# Patient Record
Sex: Male | Born: 1945 | Race: White | Hispanic: No
Health system: Southern US, Community
[De-identification: ages and names within clinical notes are randomized; demographics above are authoritative.]

## PROBLEM LIST (undated history)

## (undated) DIAGNOSIS — S069XAA Unspecified intracranial injury with loss of consciousness status unknown, initial encounter: Secondary | ICD-10-CM

## (undated) DIAGNOSIS — A0472 Enterocolitis due to Clostridium difficile, not specified as recurrent: Secondary | ICD-10-CM

## (undated) DIAGNOSIS — J942 Hemothorax: Secondary | ICD-10-CM

## (undated) DIAGNOSIS — S066X9S Traumatic subarachnoid hemorrhage with loss of consciousness of unspecified duration, sequela: Secondary | ICD-10-CM

## (undated) DIAGNOSIS — J9621 Acute and chronic respiratory failure with hypoxia: Secondary | ICD-10-CM

## (undated) DIAGNOSIS — J189 Pneumonia, unspecified organism: Secondary | ICD-10-CM

## (undated) DIAGNOSIS — S069X9A Unspecified intracranial injury with loss of consciousness of unspecified duration, initial encounter: Secondary | ICD-10-CM

## (undated) DIAGNOSIS — I482 Chronic atrial fibrillation, unspecified: Secondary | ICD-10-CM

---

## 2019-06-29 ENCOUNTER — Inpatient Hospital Stay
Admission: EM | Admit: 2019-06-29 | Discharge: 2019-07-27 | Disposition: A | Discharge status: Skilled Nursing Facility | Payer: Medicare Other | Attending: Internal Medicine | Admitting: Internal Medicine

## 2019-06-29 DIAGNOSIS — Z8781 Personal history of (healed) traumatic fracture: Secondary | ICD-10-CM

## 2019-06-29 DIAGNOSIS — I482 Chronic atrial fibrillation, unspecified: Secondary | ICD-10-CM | POA: Diagnosis present

## 2019-06-29 DIAGNOSIS — J189 Pneumonia, unspecified organism: Secondary | ICD-10-CM

## 2019-06-29 DIAGNOSIS — S069XAA Unspecified intracranial injury with loss of consciousness status unknown, initial encounter: Secondary | ICD-10-CM | POA: Diagnosis present

## 2019-06-29 DIAGNOSIS — J90 Pleural effusion, not elsewhere classified: Secondary | ICD-10-CM

## 2019-06-29 DIAGNOSIS — S066X9S Traumatic subarachnoid hemorrhage with loss of consciousness of unspecified duration, sequela: Secondary | ICD-10-CM | POA: Diagnosis present

## 2019-06-29 DIAGNOSIS — S82401A Unspecified fracture of shaft of right fibula, initial encounter for closed fracture: Secondary | ICD-10-CM

## 2019-06-29 DIAGNOSIS — S82209A Unspecified fracture of shaft of unspecified tibia, initial encounter for closed fracture: Secondary | ICD-10-CM

## 2019-06-29 DIAGNOSIS — T859XXA Unspecified complication of internal prosthetic device, implant and graft, initial encounter: Secondary | ICD-10-CM

## 2019-06-29 DIAGNOSIS — J9690 Respiratory failure, unspecified, unspecified whether with hypoxia or hypercapnia: Secondary | ICD-10-CM

## 2019-06-29 DIAGNOSIS — R319 Hematuria, unspecified: Secondary | ICD-10-CM

## 2019-06-29 DIAGNOSIS — Z931 Gastrostomy status: Secondary | ICD-10-CM

## 2019-06-29 DIAGNOSIS — S82409A Unspecified fracture of shaft of unspecified fibula, initial encounter for closed fracture: Secondary | ICD-10-CM

## 2019-06-29 DIAGNOSIS — S82201A Unspecified fracture of shaft of right tibia, initial encounter for closed fracture: Secondary | ICD-10-CM

## 2019-06-29 DIAGNOSIS — S069X9A Unspecified intracranial injury with loss of consciousness of unspecified duration, initial encounter: Secondary | ICD-10-CM | POA: Diagnosis present

## 2019-06-29 DIAGNOSIS — Z4682 Encounter for fitting and adjustment of non-vascular catheter: Secondary | ICD-10-CM

## 2019-06-29 DIAGNOSIS — S8255XA Nondisplaced fracture of medial malleolus of left tibia, initial encounter for closed fracture: Secondary | ICD-10-CM

## 2019-06-29 DIAGNOSIS — A0472 Enterocolitis due to Clostridium difficile, not specified as recurrent: Secondary | ICD-10-CM | POA: Diagnosis present

## 2019-06-29 DIAGNOSIS — J9621 Acute and chronic respiratory failure with hypoxia: Secondary | ICD-10-CM | POA: Diagnosis present

## 2019-06-29 HISTORY — DX: Unspecified intracranial injury with loss of consciousness status unknown, initial encounter: S06.9XAA

## 2019-06-29 HISTORY — DX: Unspecified intracranial injury with loss of consciousness of unspecified duration, initial encounter: S06.9X9A

## 2019-06-29 HISTORY — DX: Acute and chronic respiratory failure with hypoxia: J96.21

## 2019-06-29 HISTORY — DX: Pneumonia, unspecified organism: J18.9

## 2019-06-29 HISTORY — DX: Hemothorax: J94.2

## 2019-06-29 HISTORY — DX: Enterocolitis due to Clostridium difficile, not specified as recurrent: A04.72

## 2019-06-29 HISTORY — DX: Chronic atrial fibrillation, unspecified: I48.20

## 2019-06-29 HISTORY — DX: Traumatic subarachnoid hemorrhage with loss of consciousness of unspecified duration, sequela: S06.6X9S

## 2019-06-30 ENCOUNTER — Other Ambulatory Visit (HOSPITAL_COMMUNITY): Payer: Medicare Other

## 2019-06-30 DIAGNOSIS — A0472 Enterocolitis due to Clostridium difficile, not specified as recurrent: Secondary | ICD-10-CM | POA: Diagnosis not present

## 2019-06-30 DIAGNOSIS — J9621 Acute and chronic respiratory failure with hypoxia: Secondary | ICD-10-CM

## 2019-06-30 DIAGNOSIS — S069X9S Unspecified intracranial injury with loss of consciousness of unspecified duration, sequela: Secondary | ICD-10-CM

## 2019-06-30 DIAGNOSIS — S066X9S Traumatic subarachnoid hemorrhage with loss of consciousness of unspecified duration, sequela: Secondary | ICD-10-CM

## 2019-06-30 DIAGNOSIS — I482 Chronic atrial fibrillation, unspecified: Secondary | ICD-10-CM

## 2019-06-30 DIAGNOSIS — J189 Pneumonia, unspecified organism: Secondary | ICD-10-CM

## 2019-06-30 LAB — SARS CORONAVIRUS 2 (TAT 6-24 HRS): SARS Coronavirus 2: NEGATIVE

## 2019-06-30 LAB — BLOOD GAS, ARTERIAL
Acid-Base Excess: 5.8 mmol/L — ABNORMAL HIGH (ref 0.0–2.0)
Bicarbonate: 28.5 mmol/L — ABNORMAL HIGH (ref 20.0–28.0)
FIO2: 28
O2 Saturation: 95.1 %
Patient temperature: 37
pCO2 arterial: 32.2 mmHg (ref 32.0–48.0)
pH, Arterial: 7.555 — ABNORMAL HIGH (ref 7.350–7.450)
pO2, Arterial: 66.8 mmHg — ABNORMAL LOW (ref 83.0–108.0)

## 2019-06-30 LAB — COMPREHENSIVE METABOLIC PANEL
ALT: 95 U/L — ABNORMAL HIGH (ref 0–44)
AST: 51 U/L — ABNORMAL HIGH (ref 15–41)
Albumin: 2.5 g/dL — ABNORMAL LOW (ref 3.5–5.0)
Alkaline Phosphatase: 358 U/L — ABNORMAL HIGH (ref 38–126)
Anion gap: 10 (ref 5–15)
BUN: 22 mg/dL (ref 8–23)
CO2: 26 mmol/L (ref 22–32)
Calcium: 8.5 mg/dL — ABNORMAL LOW (ref 8.9–10.3)
Chloride: 100 mmol/L (ref 98–111)
Creatinine, Ser: 0.53 mg/dL — ABNORMAL LOW (ref 0.61–1.24)
GFR calc Af Amer: >60 mL/min (ref >60)
GFR calc non Af Amer: >60 mL/min (ref >60)
Glucose, Bld: 115 mg/dL — ABNORMAL HIGH (ref 70–99)
Potassium: 4 mmol/L (ref 3.5–5.1)
Sodium: 136 mmol/L (ref 135–145)
Total Bilirubin: 1.1 mg/dL (ref 0.3–1.2)
Total Protein: 6.3 g/dL — ABNORMAL LOW (ref 6.5–8.1)

## 2019-06-30 LAB — CBC
HCT: 29 % — ABNORMAL LOW (ref 39.0–52.0)
Hemoglobin: 9 g/dL — ABNORMAL LOW (ref 13.0–17.0)
MCH: 28.1 pg (ref 26.0–34.0)
MCHC: 31 g/dL (ref 30.0–36.0)
MCV: 90.6 fL (ref 80.0–100.0)
Platelets: 471 10*3/uL — ABNORMAL HIGH (ref 150–400)
RBC: 3.2 MIL/uL — ABNORMAL LOW (ref 4.22–5.81)
RDW: 17.8 % — ABNORMAL HIGH (ref 11.5–15.5)
WBC: 11.2 10*3/uL — ABNORMAL HIGH (ref 4.0–10.5)
nRBC: 0.8 % — ABNORMAL HIGH (ref 0.0–0.2)

## 2019-06-30 LAB — C-REACTIVE PROTEIN: CRP: 8.5 mg/dL — ABNORMAL HIGH (ref <1.0)

## 2019-06-30 LAB — PROTIME-INR
INR: 1.2 (ref 0.8–1.2)
Prothrombin Time: 15.2 seconds (ref 11.4–15.2)

## 2019-06-30 MED ORDER — SODIUM CHLORIDE 3 % IN NEBU
3.00 | INHALATION_SOLUTION | RESPIRATORY_TRACT | Status: DC
Start: ? — End: 2019-06-30

## 2019-06-30 MED ORDER — GENERIC EXTERNAL MEDICATION
Status: DC
Start: ? — End: 2019-06-30

## 2019-06-30 MED ORDER — CALCIUM CARBONATE ANTACID 500 MG PO CHEW
1.00 | CHEWABLE_TABLET | ORAL | Status: DC
Start: 2019-06-30 — End: 2019-06-30

## 2019-06-30 MED ORDER — ACETAMINOPHEN 160 MG/5ML PO SOLN
650.00 | ORAL | Status: DC
Start: ? — End: 2019-06-30

## 2019-06-30 MED ORDER — GENERIC EXTERNAL MEDICATION
2.00 | Status: DC
Start: 2019-06-29 — End: 2019-06-30

## 2019-06-30 MED ORDER — AMANTADINE HCL 50 MG/5ML PO SYRP
100.00 | ORAL_SOLUTION | ORAL | Status: DC
Start: 2019-06-30 — End: 2019-06-30

## 2019-06-30 MED ORDER — GLUCOSE 40 % PO GEL
1.00 | ORAL | Status: DC
Start: ? — End: 2019-06-30

## 2019-06-30 MED ORDER — IOHEXOL 300 MG/ML  SOLN
40.0000 mL | Freq: Once | INTRAMUSCULAR | Status: AC | PRN
  Administered 2019-06-30: 01:00:00 40 mL

## 2019-06-30 MED ORDER — GENERIC EXTERNAL MEDICATION
6.00 | Status: DC
Start: 2019-06-29 — End: 2019-06-30

## 2019-06-30 MED ORDER — GENERIC EXTERNAL MEDICATION
50.00 | Status: DC
Start: 2019-06-29 — End: 2019-06-30

## 2019-06-30 MED ORDER — INSULIN ASPART 100 UNIT/ML FLEXPEN
PEN_INJECTOR | SUBCUTANEOUS | Status: DC
Start: 2019-06-30 — End: 2019-06-30

## 2019-06-30 MED ORDER — GENERIC EXTERNAL MEDICATION
5.00 | Status: DC
Start: ? — End: 2019-06-30

## 2019-06-30 MED ORDER — NUTRISOURCE FIBER PO PACK
1.00 | PACK | ORAL | Status: DC
Start: 2019-06-29 — End: 2019-06-30

## 2019-06-30 MED ORDER — FAMOTIDINE 20 MG PO TABS
20.00 | ORAL_TABLET | ORAL | Status: DC
Start: 2019-06-29 — End: 2019-06-30

## 2019-06-30 MED ORDER — MULTIVITAMIN & MINERAL PO LIQD
15.00 | ORAL | Status: DC
Start: 2019-06-30 — End: 2019-06-30

## 2019-06-30 MED ORDER — DEXTROSE 50 % IV SOLN
25.00 | INTRAVENOUS | Status: DC
Start: ? — End: 2019-06-30

## 2019-06-30 MED ORDER — GENERIC EXTERNAL MEDICATION
1.00 | Status: DC
Start: ? — End: 2019-06-30

## 2019-06-30 MED ORDER — BACITRACIN ZINC 500 UNIT/GM EX OINT
1.00 | TOPICAL_OINTMENT | CUTANEOUS | Status: DC
Start: 2019-06-29 — End: 2019-06-30

## 2019-06-30 MED ORDER — METOPROLOL TARTRATE 5 MG/5ML IV SOLN
5.00 | INTRAVENOUS | Status: DC
Start: ? — End: 2019-06-30

## 2019-06-30 MED ORDER — ALBUTEROL SULFATE (2.5 MG/3ML) 0.083% IN NEBU
2.50 | INHALATION_SOLUTION | RESPIRATORY_TRACT | Status: DC
Start: 2019-06-30 — End: 2019-06-30

## 2019-06-30 MED ORDER — GLUCAGON (RDNA) 1 MG IJ KIT
1.00 | PACK | INTRAMUSCULAR | Status: DC
Start: ? — End: 2019-06-30

## 2019-06-30 MED ORDER — ONDANSETRON HCL 4 MG/2ML IJ SOLN
4.00 | INTRAMUSCULAR | Status: DC
Start: ? — End: 2019-06-30

## 2019-06-30 MED ORDER — RA PROBIOTIC DIGESTIVE CARE PO CAPS
1.00 | ORAL_CAPSULE | ORAL | Status: DC
Start: 2019-06-29 — End: 2019-06-30

## 2019-06-30 MED ORDER — ASCORBIC ACID 500 MG PO TABS
250.00 | ORAL_TABLET | ORAL | Status: DC
Start: 2019-06-30 — End: 2019-06-30

## 2019-06-30 MED ORDER — COLLAGENASE 250 UNIT/GM EX OINT
TOPICAL_OINTMENT | CUTANEOUS | Status: DC
Start: 2019-06-30 — End: 2019-06-30

## 2019-06-30 MED ORDER — POLYETHYLENE GLYCOL 3350 17 GM/SCOOP PO POWD
17.00 | ORAL | Status: DC
Start: 2019-06-29 — End: 2019-06-30
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
## 2019-07-01 DIAGNOSIS — A0472 Enterocolitis due to Clostridium difficile, not specified as recurrent: Secondary | ICD-10-CM | POA: Diagnosis not present

## 2019-07-01 DIAGNOSIS — J9621 Acute and chronic respiratory failure with hypoxia: Secondary | ICD-10-CM | POA: Diagnosis not present

## 2019-07-01 DIAGNOSIS — I482 Chronic atrial fibrillation, unspecified: Secondary | ICD-10-CM | POA: Diagnosis not present

## 2019-07-01 DIAGNOSIS — J189 Pneumonia, unspecified organism: Secondary | ICD-10-CM | POA: Diagnosis not present

## 2019-07-01 NOTE — Progress Notes (Signed)
Pulmonary Critical Care Medicine [place]   PULMONARY CRITICAL CARE SERVICE  PROGRESS NOTE  Date of Service: 07/01/2019  Anup Brigham  SAY:[id number]  DOB: Jul 14, 1945   DOA: 06/29/2019  Referring Physician: Merton Border, MD  HPI: Paul West is a 74 y.o. male seen for follow up of Acute on Chronic Respiratory Failure.  Patient currently is on 35% FiO2 on T collar has a #8 tracheostomy in place discussed during rounds we will go ahead and change the trach out today  Medications: Reviewed on Rounds  Physical Exam:  Vitals: Temperature 99.4 pulse 86 respiratory rate 34 blood pressure is 152/64 saturations 95%  Ventilator Settings on T collar with an FiO2 of 35%  . General: Comfortable at this time . Eyes: Grossly normal lids, irises & conjunctiva . ENT: grossly tongue is normal . Neck: no obvious mass . Cardiovascular: S1 S2 normal no gallop . Respiratory: Scattered rhonchi expansion is equal at this time . Abdomen: soft . Skin: no rash seen on limited exam . Musculoskeletal: not rigid . Psychiatric:unable to assess . Neurologic: no seizure no involuntary movements         Lab Data:   Basic Metabolic Panel: Recent Labs  Lab 06/30/19 0322  NA 136  K 4.0  CL 100  CO2 26  GLUCOSE 115*  BUN 22  CREATININE 0.53*  CALCIUM 8.5*    ABG: Recent Labs  Lab 06/30/19 0302  PHART 7.555*  PCO2ART 32.2  PO2ART 66.8*  HCO3 28.5*  O2SAT 95.1    Liver Function Tests: Recent Labs  Lab 06/30/19 0322  AST 51*  ALT 95*  ALKPHOS 358*  BILITOT 1.1  PROT 6.3*  ALBUMIN 2.5*   No results for input(s): LIPASE, AMYLASE in the last 168 hours. No results for input(s): AMMONIA in the last 168 hours.  CBC: Recent Labs  Lab 06/30/19 0322  WBC 11.2*  HGB 9.0*  HCT 29.0*  MCV 90.6  PLT 471*    Cardiac Enzymes: No results for input(s): CKTOTAL, CKMB, CKMBINDEX, TROPONINI in the last 168 hours.  BNP (last 3 results) No results for input(s):  BNP in the last 8760 hours.  ProBNP (last 3 results) No results for input(s): PROBNP in the last 8760 hours.  Radiological Exams: DG ABDOMEN PEG TUBE LOCATION  Result Date: 06/30/2019 CLINICAL DATA:  74 year old male with respiratory failure. EXAM: PORTABLE CHEST 1 VIEW PORTABLE ABDOMEN ONE VIEW. COMPARISON:  None. FINDINGS: There is mild cardiomegaly with vascular congestion. Small bilateral pleural effusions and bibasilar atelectasis versus infiltrate, right greater than left. No pneumothorax. Tracheostomy with tip above the carina. Percutaneous gastrostomy with balloon over the body of the stomach. A right-sided percutaneous pigtail catheter with tip projecting over the heart. The osseous structures are intact. IMPRESSION: 1. Mild cardiomegaly with mild vascular congestion. 2. Small bilateral pleural effusions and bibasilar atelectasis versus infiltrate. 3. Support apparatus as described. Electronically Signed   By: Anner Crete M.D.   On: 06/30/2019 01:17   DG Chest Port 1 View  Result Date: 06/30/2019 CLINICAL DATA:  75 year old male with respiratory failure. EXAM: PORTABLE CHEST 1 VIEW PORTABLE ABDOMEN ONE VIEW. COMPARISON:  None. FINDINGS: There is mild cardiomegaly with vascular congestion. Small bilateral pleural effusions and bibasilar atelectasis versus infiltrate, right greater than left. No pneumothorax. Tracheostomy with tip above the carina. Percutaneous gastrostomy with balloon over the body of the stomach. A right-sided percutaneous pigtail catheter with tip projecting over the heart. The osseous structures are intact. IMPRESSION: 1. Mild cardiomegaly  with mild vascular congestion. 2. Small bilateral pleural effusions and bibasilar atelectasis versus infiltrate. 3. Support apparatus as described. Electronically Signed   By: Elgie Collard M.D.   On: 06/30/2019 01:17    Assessment/Plan Active Problems:   Acute on chronic respiratory failure with hypoxia (HCC)   Chronic atrial  fibrillation (HCC)   Traumatic brain injury (HCC)   Traumatic subarachnoid hemorrhage with loss of consciousness of unspecified duration, sequela (HCC)   Healthcare-associated pneumonia   Clostridium enterocolitis   1. Acute on chronic respiratory failure hypoxia the plan is to continue with T collar trials patient currently is on 35% FiO2 good saturations are noted.  Continue pulmonary toilet supportive care patient's tracheostomy will be changed out today has a rather large trach and I think smaller trach will help [place] to be able to wean. 2. Cardiomegaly congestive heart failure patient's last chest x-ray shows cardiomegaly and vascular congestion would diurese as tolerated. 3. Traumatic brain injury no changes at this time we will continue supportive care 4. Chronic atrial fibrillation rate controlled 5. Healthcare associated pneumonia treated follow radiologically 6. C. difficile colitis treated we will continue with supportive care   I have personally seen and evaluated the patient, evaluated laboratory and imaging results, formulated the assessment and plan and placed orders. The Patient requires high complexity decision making with multiple systems involvement.  Rounds were done with the Respiratory Therapy Director and Staff therapists and discussed with nursing staff also.  Yevonne Pax, MD [facility] Pulmonary Critical Care Medicine Sleep Medicine

## 2019-07-02 DIAGNOSIS — I482 Chronic atrial fibrillation, unspecified: Secondary | ICD-10-CM | POA: Diagnosis not present

## 2019-07-02 DIAGNOSIS — J9621 Acute and chronic respiratory failure with hypoxia: Secondary | ICD-10-CM | POA: Diagnosis not present

## 2019-07-02 DIAGNOSIS — J189 Pneumonia, unspecified organism: Secondary | ICD-10-CM | POA: Diagnosis not present

## 2019-07-02 DIAGNOSIS — A0472 Enterocolitis due to Clostridium difficile, not specified as recurrent: Secondary | ICD-10-CM | POA: Diagnosis not present

## 2019-07-02 NOTE — Progress Notes (Signed)
Pulmonary Critical Care Medicine [facility] [place]   PULMONARY CRITICAL CARE SERVICE  PROGRESS NOTE  Date of Service: 07/02/2019  Paul West  CXK:[id number]  DOB: Feb 17, 1946   DOA: 06/29/2019  Referring Physician: Carron Curie, MD  HPI: Paul West is a 74 y.o. male seen for follow up of Acute on Chronic Respiratory Failure.  He is weaning on T collar and is on 28% FiO2 tracheostomy was changed out yesterday and looks good right now is also using the PMV with no issues  Medications: Reviewed on Rounds  Physical Exam:  Vitals: Temperature 99.5 pulse 88 respiratory 20 blood pressure is 143/71 saturations 96%  Ventilator Settings off the ventilator right now on T collar FiO2 28%  . General: Comfortable at this time . Eyes: Grossly normal lids, irises & conjunctiva . ENT: grossly tongue is normal . Neck: no obvious mass . Cardiovascular: S1 S2 normal no gallop . Respiratory: No rhonchi coarse breath sounds are noted . Abdomen: soft . Skin: no rash seen on limited exam . Musculoskeletal: not rigid . Psychiatric:unable to assess . Neurologic: no seizure no involuntary movements         Lab Data:   Basic Metabolic Panel: Recent Labs  Lab 06/30/19 0322  NA 136  K 4.0  CL 100  CO2 26  GLUCOSE 115*  BUN 22  CREATININE 0.53*  CALCIUM 8.5*    ABG: Recent Labs  Lab 06/30/19 0302  PHART 7.555*  PCO2ART 32.2  PO2ART 66.8*  HCO3 28.5*  O2SAT 95.1    Liver Function Tests: Recent Labs  Lab 06/30/19 0322  AST 51*  ALT 95*  ALKPHOS 358*  BILITOT 1.1  PROT 6.3*  ALBUMIN 2.5*   No results for input(s): LIPASE, AMYLASE in the last 168 hours. No results for input(s): AMMONIA in the last 168 hours.  CBC: Recent Labs  Lab 06/30/19 0322  WBC 11.2*  HGB 9.0*  HCT 29.0*  MCV 90.6  PLT 471*    Cardiac Enzymes: No results for input(s): CKTOTAL, CKMB, CKMBINDEX, TROPONINI in the last 168 hours.  BNP (last 3 results) No results for input(s):  BNP in the last 8760 hours.  ProBNP (last 3 results) No results for input(s): PROBNP in the last 8760 hours.  Radiological Exams: No results found.  Assessment/Plan Active Problems:   Acute on chronic respiratory failure with hypoxia (HCC)   Chronic atrial fibrillation (HCC)   Traumatic brain injury (HCC)   Traumatic subarachnoid hemorrhage with loss of consciousness of unspecified duration, sequela (HCC)   Healthcare-associated pneumonia   Clostridium enterocolitis   1. Acute on chronic respiratory failure hypoxia continue O2 collar weaning patient is doing well with PMV plan is to continue to advance the PMV as tolerated 2. Chronic atrial fibrillation rate controlled continue supportive care 3. Traumatic subarachnoid hemorrhage with traumatic brain injury patient is at baseline still remains confused.  Periodically agitated 4. Healthcare associated pneumonia treated 5. C. difficile colitis at baseline we will continue with supportive care   I have personally seen and evaluated the patient, evaluated laboratory and imaging results, formulated the assessment and plan and placed orders. The Patient requires high complexity decision making with multiple systems involvement.  Time 35 minutes Rounds were done with the Respiratory Therapy Director and Staff therapists and discussed with nursing staff also.  Yevonne Pax, MD [facility] Pulmonary Critical Care Medicine Sleep Medicine

## 2019-07-03 ENCOUNTER — Encounter: Payer: Self-pay | Admitting: Internal Medicine

## 2019-07-03 DIAGNOSIS — S069XAA Unspecified intracranial injury with loss of consciousness status unknown, initial encounter: Secondary | ICD-10-CM | POA: Diagnosis present

## 2019-07-03 DIAGNOSIS — J9621 Acute and chronic respiratory failure with hypoxia: Secondary | ICD-10-CM | POA: Diagnosis present

## 2019-07-03 DIAGNOSIS — S066X9S Traumatic subarachnoid hemorrhage with loss of consciousness of unspecified duration, sequela: Secondary | ICD-10-CM | POA: Diagnosis present

## 2019-07-03 DIAGNOSIS — I482 Chronic atrial fibrillation, unspecified: Secondary | ICD-10-CM | POA: Diagnosis not present

## 2019-07-03 DIAGNOSIS — A0472 Enterocolitis due to Clostridium difficile, not specified as recurrent: Secondary | ICD-10-CM | POA: Diagnosis not present

## 2019-07-03 DIAGNOSIS — S069X9A Unspecified intracranial injury with loss of consciousness of unspecified duration, initial encounter: Secondary | ICD-10-CM | POA: Diagnosis present

## 2019-07-03 DIAGNOSIS — J189 Pneumonia, unspecified organism: Secondary | ICD-10-CM | POA: Diagnosis not present

## 2019-07-03 LAB — CBC
HCT: 32 % — ABNORMAL LOW (ref 39.0–52.0)
Hemoglobin: 9.3 g/dL — ABNORMAL LOW (ref 13.0–17.0)
MCH: 27.5 pg (ref 26.0–34.0)
MCHC: 29.1 g/dL — ABNORMAL LOW (ref 30.0–36.0)
MCV: 94.7 fL (ref 80.0–100.0)
Platelets: 396 10*3/uL (ref 150–400)
RBC: 3.38 MIL/uL — ABNORMAL LOW (ref 4.22–5.81)
RDW: 18.4 % — ABNORMAL HIGH (ref 11.5–15.5)
WBC: 8.8 10*3/uL (ref 4.0–10.5)
nRBC: 0.2 % (ref 0.0–0.2)

## 2019-07-03 LAB — BASIC METABOLIC PANEL
Anion gap: 13 (ref 5–15)
BUN: 21 mg/dL (ref 8–23)
CO2: 21 mmol/L — ABNORMAL LOW (ref 22–32)
Calcium: 8.1 mg/dL — ABNORMAL LOW (ref 8.9–10.3)
Chloride: 102 mmol/L (ref 98–111)
Creatinine, Ser: 0.48 mg/dL — ABNORMAL LOW (ref 0.61–1.24)
GFR calc Af Amer: >60 mL/min (ref >60)
GFR calc non Af Amer: >60 mL/min (ref >60)
Glucose, Bld: 134 mg/dL — ABNORMAL HIGH (ref 70–99)
Potassium: 4.1 mmol/L (ref 3.5–5.1)
Sodium: 136 mmol/L (ref 135–145)

## 2019-07-03 NOTE — Progress Notes (Signed)
Pulmonary Critical Care Medicine [place]   PULMONARY CRITICAL CARE SERVICE  PROGRESS NOTE  Date of Service: 07/03/2019  Paul West  [id number]  DOB: [birth year]   DOA: 06/29/2019  Referring Physician: Merton Border, MD  HPI: Paul West is a 74 y.o. male seen for follow up of Acute on Chronic Respiratory Failure.  Patient right now is on T collar doing fairly well.  Has been tolerating the PMV without any issues.  Patient should be able to begin with capping trials.  Still is confused however  Medications: Reviewed on Rounds  Physical Exam:  Vitals: Temperature 98.4 pulse 82 respiratory rate 33 blood pressure is 128/78 saturations 97%  Ventilator Settings on T collar with an FiO2 28% using PMV  . General: Comfortable at this time . Eyes: Grossly normal lids, irises & conjunctiva . ENT: grossly tongue is normal . Neck: no obvious mass . Cardiovascular: S1 S2 normal no gallop . Respiratory: No rhonchi no rales are noted at this time . Abdomen: soft . Skin: no rash seen on limited exam . Musculoskeletal: not rigid . Psychiatric:unable to assess . Neurologic: no seizure no involuntary movements         Lab Data:   Basic Metabolic Panel: Recent Labs  Lab 06/30/19 0322 07/03/19 0540  NA 136 136  K 4.0 4.1  CL 100 102  CO2 26 21*  GLUCOSE 115* 134*  BUN 22 21  CREATININE 0.53* 0.48*  CALCIUM 8.5* 8.1*    ABG: Recent Labs  Lab 06/30/19 0302  PHART 7.555*  PCO2ART 32.2  PO2ART 66.8*  HCO3 28.5*  O2SAT 95.1    Liver Function Tests: Recent Labs  Lab 06/30/19 0322  AST 51*  ALT 95*  ALKPHOS 358*  BILITOT 1.1  PROT 6.3*  ALBUMIN 2.5*   No results for input(s): LIPASE, AMYLASE in the last 168 hours. No results for input(s): AMMONIA in the last 168 hours.  CBC: Recent Labs  Lab 06/30/19 0322 07/03/19 0540  WBC 11.2* 8.8  HGB 9.0* 9.3*  HCT 29.0* 32.0*  MCV 90.6 94.7  PLT 471* 396    Cardiac Enzymes: No results  for input(s): CKTOTAL, CKMB, CKMBINDEX, TROPONINI in the last 168 hours.  BNP (last 3 results) No results for input(s): BNP in the last 8760 hours.  ProBNP (last 3 results) No results for input(s): PROBNP in the last 8760 hours.  Radiological Exams: No results found.  Assessment/Plan Active Problems:   Acute on chronic respiratory failure with hypoxia (HCC)   Chronic atrial fibrillation (HCC)   Traumatic brain injury ([place])   Traumatic subarachnoid hemorrhage with loss of consciousness of unspecified duration, sequela (HCC)   Healthcare-associated pneumonia   Clostridium enterocolitis   1. Acute on chronic respiratory failure hypoxia plan is to continue to advance on the weaning will change the trach out to a cuffless #6 trach today and then have asked for respiratory therapy to proceed to begin with the capping trials. 2. Chronic atrial fibrillation rate is controlled we will continue with supportive care 3. Traumatic brain injury no changes are noted at this time 4. Subarachnoid hemorrhage stable we will continue to monitor 5. Healthcare associated pneumonia treated continue present management 6. C. difficile colitis stable no active diarrhea as noted 7. Congestive heart failure compensated follow-up on x-rays as necessary   I have personally seen and evaluated the patient, evaluated laboratory and imaging results, formulated the assessment and plan and placed orders. The Patient requires high complexity decision making  with multiple systems involvement.  Time 35 minutes Rounds were done with the Respiratory Therapy Director and Staff therapists and discussed with nursing staff also.  Yevonne Pax, MD [facility] Pulmonary Critical Care Medicine Sleep Medicine

## 2019-07-04 DIAGNOSIS — I482 Chronic atrial fibrillation, unspecified: Secondary | ICD-10-CM | POA: Diagnosis not present

## 2019-07-04 DIAGNOSIS — J9621 Acute and chronic respiratory failure with hypoxia: Secondary | ICD-10-CM | POA: Diagnosis not present

## 2019-07-04 DIAGNOSIS — J189 Pneumonia, unspecified organism: Secondary | ICD-10-CM | POA: Diagnosis not present

## 2019-07-04 DIAGNOSIS — A0472 Enterocolitis due to Clostridium difficile, not specified as recurrent: Secondary | ICD-10-CM | POA: Diagnosis not present

## 2019-07-04 NOTE — Progress Notes (Addendum)
Pulmonary Critical Care Medicine [facility] [place]   PULMONARY CRITICAL CARE SERVICE  PROGRESS NOTE  Date of Service: 07/04/2019  Paul West  [id number]  DOB: 03/07/1946   DOA: 06/29/2019  Referring Physician: Carron Curie, MD  HPI: Paul West is a 74 y.o. male seen for follow up of Acute on Chronic Respiratory Failure.  Patient has been 24 hours capped on room air satting well with no fever distress.  Medications: Reviewed on Rounds  Physical Exam:  Vitals: Pulse 75 respiration 15 BP 140/64 O2 sat 99% temp 97.7  Ventilator Settings room air  . General: Comfortable at this time . Eyes: Grossly normal lids, irises & conjunctiva . ENT: grossly tongue is normal . Neck: no obvious mass . Cardiovascular: S1 S2 normal no gallop . Respiratory: No rales or rhonchi noted . Abdomen: soft . Skin: no rash seen on limited exam . Musculoskeletal: not rigid . Psychiatric:unable to assess . Neurologic: no seizure no involuntary movements         Lab Data:   Basic Metabolic Panel: Recent Labs  Lab 06/30/19 0322 07/03/19 0540  NA 136 136  K 4.0 4.1  CL 100 102  CO2 26 21*  GLUCOSE 115* 134*  BUN 22 21  CREATININE 0.53* 0.48*  CALCIUM 8.5* 8.1*    ABG: Recent Labs  Lab 06/30/19 0302  PHART 7.555*  PCO2ART 32.2  PO2ART 66.8*  HCO3 28.5*  O2SAT 95.1    Liver Function Tests: Recent Labs  Lab 06/30/19 0322  AST 51*  ALT 95*  ALKPHOS 358*  BILITOT 1.1  PROT 6.3*  ALBUMIN 2.5*   No results for input(s): LIPASE, AMYLASE in the last 168 hours. No results for input(s): AMMONIA in the last 168 hours.  CBC: Recent Labs  Lab 06/30/19 0322 07/03/19 0540  WBC 11.2* 8.8  HGB 9.0* 9.3*  HCT 29.0* 32.0*  MCV 90.6 94.7  PLT 471* 396    Cardiac Enzymes: No results for input(s): CKTOTAL, CKMB, CKMBINDEX, TROPONINI in the last 168 hours.  BNP (last 3 results) No results for input(s): BNP in the last 8760 hours.  ProBNP (last 3 results) No  results for input(s): PROBNP in the last 8760 hours.  Radiological Exams: No results found.  Assessment/Plan Active Problems:   Acute on chronic respiratory failure with hypoxia (HCC)   Chronic atrial fibrillation (HCC)   Traumatic brain injury (HCC)   Traumatic subarachnoid hemorrhage with loss of consciousness of unspecified duration, sequela (HCC)   Healthcare-associated pneumonia   Clostridium enterocolitis   1. Acute on chronic respiratory failure hypoxia continue with capping.  Patient been capped 24 hours as of today on room air satting well.  Continue aggressive pulmonary toilet supportive measures. 2. Chronic atrial fibrillation rate is controlled we will continue with supportive care 3. Traumatic brain injury no changes are noted at this time 4. Subarachnoid hemorrhage stable we will continue to monitor 5. Healthcare associated pneumonia treated continue present management 6. C. difficile colitis stable no active diarrhea as noted 7. Congestive heart failure compensated follow-up on x-rays as necessary   I have personally seen and evaluated the patient, evaluated laboratory and imaging results, formulated the assessment and plan and placed orders. The Patient requires high complexity decision making with multiple systems involvement.  Rounds were done with the Respiratory Therapy Director and Staff therapists and discussed with nursing staff also.  Yevonne Pax, MD [facility] Pulmonary Critical Care Medicine Sleep Medicine

## 2019-07-05 DIAGNOSIS — J189 Pneumonia, unspecified organism: Secondary | ICD-10-CM | POA: Diagnosis not present

## 2019-07-05 DIAGNOSIS — I482 Chronic atrial fibrillation, unspecified: Secondary | ICD-10-CM | POA: Diagnosis not present

## 2019-07-05 DIAGNOSIS — J9621 Acute and chronic respiratory failure with hypoxia: Secondary | ICD-10-CM | POA: Diagnosis not present

## 2019-07-05 DIAGNOSIS — A0472 Enterocolitis due to Clostridium difficile, not specified as recurrent: Secondary | ICD-10-CM | POA: Diagnosis not present

## 2019-07-05 NOTE — Progress Notes (Signed)
Pulmonary Critical Care Medicine [facility] [place]   PULMONARY CRITICAL CARE SERVICE  PROGRESS NOTE  Date of Service: 07/05/2019  Paul West  WUJ:[id number]  DOB: [birth year]   DOA: 06/29/2019  Referring Physician: Carron Curie, MD  HPI: Paul West is a 74 y.o. male seen for follow up of Acute on Chronic Respiratory Failure.  Patient is capping on room air has been capping now for 48 hours  Medications: Reviewed on Rounds  Physical Exam:  Vitals: Temperature 99.0 pulse 81 respiratory rate 28 blood pressure is 131/69 saturations 94%  Ventilator Settings capping on room air  . General: Comfortable at this time . Eyes: Grossly normal lids, irises & conjunctiva . ENT: grossly tongue is normal . Neck: no obvious mass . Cardiovascular: S1 S2 normal no gallop . Respiratory: No rhonchi no rales are noted at this time . Abdomen: soft . Skin: no rash seen on limited exam . Musculoskeletal: not rigid . Psychiatric:unable to assess . Neurologic: no seizure no involuntary movements         Lab Data:   Basic Metabolic Panel: Recent Labs  Lab 06/30/19 0322 07/03/19 0540  NA 136 136  K 4.0 4.1  CL 100 102  CO2 26 21*  GLUCOSE 115* 134*  BUN 22 21  CREATININE 0.53* 0.48*  CALCIUM 8.5* 8.1*    ABG: Recent Labs  Lab 06/30/19 0302  PHART 7.555*  PCO2ART 32.2  PO2ART 66.8*  HCO3 28.5*  O2SAT 95.1    Liver Function Tests: Recent Labs  Lab 06/30/19 0322  AST 51*  ALT 95*  ALKPHOS 358*  BILITOT 1.1  PROT 6.3*  ALBUMIN 2.5*   No results for input(s): LIPASE, AMYLASE in the last 168 hours. No results for input(s): AMMONIA in the last 168 hours.  CBC: Recent Labs  Lab 06/30/19 0322 07/03/19 0540  WBC 11.2* 8.8  HGB 9.0* 9.3*  HCT 29.0* 32.0*  MCV 90.6 94.7  PLT 471* 396    Cardiac Enzymes: No results for input(s): CKTOTAL, CKMB, CKMBINDEX, TROPONINI in the last 168 hours.  BNP (last 3 results) No results for input(s): BNP in the  last 8760 hours.  ProBNP (last 3 results) No results for input(s): PROBNP in the last 8760 hours.  Radiological Exams: No results found.  Assessment/Plan Active Problems:   Acute on chronic respiratory failure with hypoxia (HCC)   Chronic atrial fibrillation (HCC)   Traumatic brain injury (HCC)   Traumatic subarachnoid hemorrhage with loss of consciousness of unspecified duration, sequela (HCC)   Healthcare-associated pneumonia   Clostridium enterocolitis   1. Acute on chronic respiratory failure hypoxia plan is to continue with capping trials tomorrow we should be able to decannulate 2. Chronic atrial fibrillation rate controlled 3. Traumatic brain injury no change 4. Subarachnoid hemorrhage traumatic continue with supportive care 5. Healthcare associated pneumonia at baseline 6. C. difficile colitis controlled we will continue to monitor closely.   I have personally seen and evaluated the patient, evaluated laboratory and imaging results, formulated the assessment and plan and placed orders. The Patient requires high complexity decision making with multiple systems involvement.  Rounds were done with the Respiratory Therapy Director and Staff therapists and discussed with nursing staff also.  Yevonne Pax, MD [facility] Pulmonary Critical Care Medicine Sleep Medicine

## 2019-07-06 DIAGNOSIS — J189 Pneumonia, unspecified organism: Secondary | ICD-10-CM | POA: Diagnosis not present

## 2019-07-06 DIAGNOSIS — A0472 Enterocolitis due to Clostridium difficile, not specified as recurrent: Secondary | ICD-10-CM | POA: Diagnosis not present

## 2019-07-06 DIAGNOSIS — I482 Chronic atrial fibrillation, unspecified: Secondary | ICD-10-CM | POA: Diagnosis not present

## 2019-07-06 DIAGNOSIS — J9621 Acute and chronic respiratory failure with hypoxia: Secondary | ICD-10-CM | POA: Diagnosis not present

## 2019-07-06 NOTE — Progress Notes (Signed)
Pulmonary Critical Care Medicine [facility] [place]   PULMONARY CRITICAL CARE SERVICE  PROGRESS NOTE  Date of Service: 07/06/2019  Paul West  DPO:[id number]  DOB: [birth year]   DOA: 06/29/2019  Referring Physician: Carron Curie, MD  HPI: Paul West is a 74 y.o. male seen for follow up of Acute on Chronic Respiratory Failure.  Doing well with capping has been capping now for 3 days  Medications: Reviewed on Rounds  Physical Exam:  Vitals: Temperature 97.9 pulse 117 respiratory 30 blood pressure 120/62 saturations 96%  Ventilator Settings capping off the ventilator  . General: Comfortable at this time . Eyes: Grossly normal lids, irises & conjunctiva . ENT: grossly tongue is normal . Neck: no obvious mass . Cardiovascular: S1 S2 normal no gallop . Respiratory: No rhonchi no rales are noted at this time . Abdomen: soft . Skin: no rash seen on limited exam . Musculoskeletal: not rigid . Psychiatric:unable to assess . Neurologic: no seizure no involuntary movements         Lab Data:   Basic Metabolic Panel: Recent Labs  Lab 06/30/19 0322 07/03/19 0540  NA 136 136  K 4.0 4.1  CL 100 102  CO2 26 21*  GLUCOSE 115* 134*  BUN 22 21  CREATININE 0.53* 0.48*  CALCIUM 8.5* 8.1*    ABG: Recent Labs  Lab 06/30/19 0302  PHART 7.555*  PCO2ART 32.2  PO2ART 66.8*  HCO3 28.5*  O2SAT 95.1    Liver Function Tests: Recent Labs  Lab 06/30/19 0322  AST 51*  ALT 95*  ALKPHOS 358*  BILITOT 1.1  PROT 6.3*  ALBUMIN 2.5*   No results for input(s): LIPASE, AMYLASE in the last 168 hours. No results for input(s): AMMONIA in the last 168 hours.  CBC: Recent Labs  Lab 06/30/19 0322 07/03/19 0540  WBC 11.2* 8.8  HGB 9.0* 9.3*  HCT 29.0* 32.0*  MCV 90.6 94.7  PLT 471* 396    Cardiac Enzymes: No results for input(s): CKTOTAL, CKMB, CKMBINDEX, TROPONINI in the last 168 hours.  BNP (last 3 results) No results for input(s): BNP in the last 8760  hours.  ProBNP (last 3 results) No results for input(s): PROBNP in the last 8760 hours.  Radiological Exams: No results found.  Assessment/Plan Active Problems:   Acute on chronic respiratory failure with hypoxia (HCC)   Chronic atrial fibrillation (HCC)   Traumatic brain injury (HCC)   Traumatic subarachnoid hemorrhage with loss of consciousness of unspecified duration, sequela (HCC)   Healthcare-associated pneumonia   Clostridium enterocolitis   1. Acute on chronic respiratory failure hypoxia plan is to continue with advancing the wean to decannulation 2. Chronic atrial fibrillation rate controlled 3. Traumatic brain injury no change 4. The subarachnoid hemorrhage at baseline continue with therapy as tolerated 5. Healthcare associated pneumonia treated resolved 6. C. difficile colitis treated   I have personally seen and evaluated the patient, evaluated laboratory and imaging results, formulated the assessment and plan and placed orders. The Patient requires high complexity decision making with multiple systems involvement.  Rounds were done with the Respiratory Therapy Director and Staff therapists and discussed with nursing staff also.  Yevonne Pax, MD [facility] Pulmonary Critical Care Medicine Sleep Medicine

## 2019-07-07 ENCOUNTER — Other Ambulatory Visit (HOSPITAL_COMMUNITY): Payer: Medicare Other

## 2019-07-07 DIAGNOSIS — J189 Pneumonia, unspecified organism: Secondary | ICD-10-CM | POA: Diagnosis not present

## 2019-07-07 DIAGNOSIS — I482 Chronic atrial fibrillation, unspecified: Secondary | ICD-10-CM | POA: Diagnosis not present

## 2019-07-07 DIAGNOSIS — A0472 Enterocolitis due to Clostridium difficile, not specified as recurrent: Secondary | ICD-10-CM | POA: Diagnosis not present

## 2019-07-07 DIAGNOSIS — J9621 Acute and chronic respiratory failure with hypoxia: Secondary | ICD-10-CM | POA: Diagnosis not present

## 2019-07-07 LAB — BASIC METABOLIC PANEL
Anion gap: 8 (ref 5–15)
BUN: 23 mg/dL (ref 8–23)
CO2: 26 mmol/L (ref 22–32)
Calcium: 8.6 mg/dL — ABNORMAL LOW (ref 8.9–10.3)
Chloride: 106 mmol/L (ref 98–111)
Creatinine, Ser: 0.4 mg/dL — ABNORMAL LOW (ref 0.61–1.24)
GFR calc Af Amer: >60 mL/min (ref >60)
GFR calc non Af Amer: >60 mL/min (ref >60)
Glucose, Bld: 150 mg/dL — ABNORMAL HIGH (ref 70–99)
Potassium: 4.1 mmol/L (ref 3.5–5.1)
Sodium: 140 mmol/L (ref 135–145)

## 2019-07-07 LAB — CBC
HCT: 34.6 % — ABNORMAL LOW (ref 39.0–52.0)
Hemoglobin: 10.1 g/dL — ABNORMAL LOW (ref 13.0–17.0)
MCH: 27.2 pg (ref 26.0–34.0)
MCHC: 29.2 g/dL — ABNORMAL LOW (ref 30.0–36.0)
MCV: 93.3 fL (ref 80.0–100.0)
Platelets: 434 10*3/uL — ABNORMAL HIGH (ref 150–400)
RBC: 3.71 MIL/uL — ABNORMAL LOW (ref 4.22–5.81)
RDW: 18.8 % — ABNORMAL HIGH (ref 11.5–15.5)
WBC: 9.7 10*3/uL (ref 4.0–10.5)
nRBC: 0 % (ref 0.0–0.2)

## 2019-07-07 MED ORDER — GENERIC EXTERNAL MEDICATION
Status: DC
Start: ? — End: 2019-07-07

## 2019-07-07 NOTE — Progress Notes (Signed)
Pulmonary Critical Care Medicine [facility] [place]   PULMONARY CRITICAL CARE SERVICE  PROGRESS NOTE  Date of Service: 07/07/2019  Paul West  VOJ:[id number]  DOB: 1945-10-25   DOA: 06/29/2019  Referring Physician: Carron Curie, MD  HPI: Paul West is a 74 y.o. male seen for follow up of Acute on Chronic Respiratory Failure.  Patient is decannulated yesterday doing fairly well.  Good saturations are noted at this time  Medications: Reviewed on Rounds  Physical Exam:  Vitals: Temperature 98.0 pulse 82 respiratory rate 33 blood pressure 125/65 saturations 98%  Ventilator Settings decannulated off the ventilator  . General: Comfortable at this time . Eyes: Grossly normal lids, irises & conjunctiva . ENT: grossly tongue is normal . Neck: no obvious mass . Cardiovascular: S1 S2 normal no gallop . Respiratory: No rhonchi no rales are noted at this time . Abdomen: soft . Skin: no rash seen on limited exam . Musculoskeletal: not rigid . Psychiatric:unable to assess . Neurologic: no seizure no involuntary movements         Lab Data:   Basic Metabolic Panel: Recent Labs  Lab 07/03/19 0540 07/07/19 0751  NA 136 140  K 4.1 4.1  CL 102 106  CO2 21* 26  GLUCOSE 134* 150*  BUN 21 23  CREATININE 0.48* 0.40*  CALCIUM 8.1* 8.6*    ABG: No results for input(s): PHART, PCO2ART, PO2ART, HCO3, O2SAT in the last 168 hours.  Liver Function Tests: No results for input(s): AST, ALT, ALKPHOS, BILITOT, PROT, ALBUMIN in the last 168 hours. No results for input(s): LIPASE, AMYLASE in the last 168 hours. No results for input(s): AMMONIA in the last 168 hours.  CBC: Recent Labs  Lab 07/03/19 0540 07/07/19 0751  WBC 8.8 9.7  HGB 9.3* 10.1*  HCT 32.0* 34.6*  MCV 94.7 93.3  PLT 396 434*    Cardiac Enzymes: No results for input(s): CKTOTAL, CKMB, CKMBINDEX, TROPONINI in the last 168 hours.  BNP (last 3 results) No results for input(s): BNP in the last 8760  hours.  ProBNP (last 3 results) No results for input(s): PROBNP in the last 8760 hours.  Radiological Exams: No results found.  Assessment/Plan Active Problems:   Acute on chronic respiratory failure with hypoxia (HCC)   Chronic atrial fibrillation (HCC)   Traumatic brain injury (HCC)   Traumatic subarachnoid hemorrhage with loss of consciousness of unspecified duration, sequela (HCC)   Healthcare-associated pneumonia   Clostridium enterocolitis   1. Acute on chronic respiratory failure hypoxia plan is to continue with supportive care oxygen as necessary 2. Chronic atrial fibrillation rate is controlled 3. Traumatic subarachnoid hemorrhage with history of traumatic brain injury patient is at baseline no changes are noted 4. Healthcare associated pneumonia treated clinically is improving 5. C. difficile colitis treated we will continue to follow   I have personally seen and evaluated the patient, evaluated laboratory and imaging results, formulated the assessment and plan and placed orders. The Patient requires high complexity decision making with multiple systems involvement.  Rounds were done with the Respiratory Therapy Director and Staff therapists and discussed with nursing staff also.  Yevonne Pax, MD [facility] Pulmonary Critical Care Medicine Sleep Medicine

## 2019-07-12 LAB — URINALYSIS, ROUTINE W REFLEX MICROSCOPIC

## 2019-07-12 LAB — URINALYSIS, MICROSCOPIC (REFLEX): RBC / HPF: >50 RBC/hpf (ref 0–5)

## 2019-07-13 ENCOUNTER — Other Ambulatory Visit (HOSPITAL_COMMUNITY): Payer: Medicare Other

## 2019-07-13 LAB — URINE CULTURE: Culture: <10000 — AB

## 2019-07-13 LAB — CBC
HCT: 33.8 % — ABNORMAL LOW (ref 39.0–52.0)
Hemoglobin: 9.9 g/dL — ABNORMAL LOW (ref 13.0–17.0)
MCH: 27.3 pg (ref 26.0–34.0)
MCHC: 29.3 g/dL — ABNORMAL LOW (ref 30.0–36.0)
MCV: 93.1 fL (ref 80.0–100.0)
Platelets: 435 10*3/uL — ABNORMAL HIGH (ref 150–400)
RBC: 3.63 MIL/uL — ABNORMAL LOW (ref 4.22–5.81)
RDW: 17.6 % — ABNORMAL HIGH (ref 11.5–15.5)
WBC: 11.1 10*3/uL — ABNORMAL HIGH (ref 4.0–10.5)
nRBC: 0 % (ref 0.0–0.2)

## 2019-07-13 LAB — BASIC METABOLIC PANEL
Anion gap: 10 (ref 5–15)
BUN: 28 mg/dL — ABNORMAL HIGH (ref 8–23)
CO2: 25 mmol/L (ref 22–32)
Calcium: 8.8 mg/dL — ABNORMAL LOW (ref 8.9–10.3)
Chloride: 104 mmol/L (ref 98–111)
Creatinine, Ser: 0.44 mg/dL — ABNORMAL LOW (ref 0.61–1.24)
GFR calc Af Amer: >60 mL/min (ref >60)
GFR calc non Af Amer: >60 mL/min (ref >60)
Glucose, Bld: 150 mg/dL — ABNORMAL HIGH (ref 70–99)
Potassium: 3.8 mmol/L (ref 3.5–5.1)
Sodium: 139 mmol/L (ref 135–145)

## 2019-07-13 MED ORDER — GENERIC EXTERNAL MEDICATION
Status: DC
Start: ? — End: 2019-07-13

## 2019-07-14 ENCOUNTER — Other Ambulatory Visit (HOSPITAL_COMMUNITY): Payer: Medicare Other

## 2019-07-14 MED ORDER — GENERIC EXTERNAL MEDICATION
Status: DC
Start: ? — End: 2019-07-14

## 2019-07-15 LAB — CBC
HCT: 34.9 % — ABNORMAL LOW (ref 39.0–52.0)
Hemoglobin: 10.2 g/dL — ABNORMAL LOW (ref 13.0–17.0)
MCH: 27.2 pg (ref 26.0–34.0)
MCHC: 29.2 g/dL — ABNORMAL LOW (ref 30.0–36.0)
MCV: 93.1 fL (ref 80.0–100.0)
Platelets: 470 10*3/uL — ABNORMAL HIGH (ref 150–400)
RBC: 3.75 MIL/uL — ABNORMAL LOW (ref 4.22–5.81)
RDW: 17.4 % — ABNORMAL HIGH (ref 11.5–15.5)
WBC: 10 10*3/uL (ref 4.0–10.5)
nRBC: 0 % (ref 0.0–0.2)

## 2019-07-17 LAB — CULTURE, BLOOD (ROUTINE X 2)
Culture: NO GROWTH
Culture: NO GROWTH

## 2019-07-18 LAB — CBC
HCT: 34.7 % — ABNORMAL LOW (ref 39.0–52.0)
Hemoglobin: 10.2 g/dL — ABNORMAL LOW (ref 13.0–17.0)
MCH: 27.1 pg (ref 26.0–34.0)
MCHC: 29.4 g/dL — ABNORMAL LOW (ref 30.0–36.0)
MCV: 92 fL (ref 80.0–100.0)
Platelets: 501 10*3/uL — ABNORMAL HIGH (ref 150–400)
RBC: 3.77 MIL/uL — ABNORMAL LOW (ref 4.22–5.81)
RDW: 17.4 % — ABNORMAL HIGH (ref 11.5–15.5)
WBC: 11.2 10*3/uL — ABNORMAL HIGH (ref 4.0–10.5)
nRBC: 0.2 % (ref 0.0–0.2)

## 2019-07-18 LAB — BASIC METABOLIC PANEL
Anion gap: 10 (ref 5–15)
BUN: 27 mg/dL — ABNORMAL HIGH (ref 8–23)
CO2: 27 mmol/L (ref 22–32)
Calcium: 9.1 mg/dL (ref 8.9–10.3)
Chloride: 103 mmol/L (ref 98–111)
Creatinine, Ser: 0.46 mg/dL — ABNORMAL LOW (ref 0.61–1.24)
GFR calc Af Amer: >60 mL/min (ref >60)
GFR calc non Af Amer: >60 mL/min (ref >60)
Glucose, Bld: 160 mg/dL — ABNORMAL HIGH (ref 70–99)
Potassium: 3.8 mmol/L (ref 3.5–5.1)
Sodium: 140 mmol/L (ref 135–145)

## 2019-07-21 ENCOUNTER — Other Ambulatory Visit (HOSPITAL_COMMUNITY): Payer: Medicare Other

## 2019-07-21 DIAGNOSIS — J029 Acute pharyngitis, unspecified: Secondary | ICD-10-CM

## 2019-07-21 MED ORDER — GENERIC EXTERNAL MEDICATION
Status: DC
Start: ? — End: 2019-07-21

## 2019-07-22 DIAGNOSIS — S82201A Unspecified fracture of shaft of right tibia, initial encounter for closed fracture: Secondary | ICD-10-CM

## 2019-07-22 DIAGNOSIS — Z8781 Personal history of (healed) traumatic fracture: Secondary | ICD-10-CM

## 2019-07-22 DIAGNOSIS — S8255XA Nondisplaced fracture of medial malleolus of left tibia, initial encounter for closed fracture: Secondary | ICD-10-CM

## 2019-07-22 DIAGNOSIS — S82401A Unspecified fracture of shaft of right fibula, initial encounter for closed fracture: Secondary | ICD-10-CM

## 2019-07-22 NOTE — Consult Note (Signed)
ORTHOPAEDIC CONSULTATION  REQUESTING PHYSICIAN: Carron Curie, MD  Chief Complaint: Bilateral lower extremity trauma with pelvic trauma.  HPI: Paul West is a 74 y.o. male who presents with pubic rami fractures of the right pelvis with right tibia and fibular fracture status post IM nail and a left medial malleolus fracture status post cannulated screw fixation.  Patient is seen for initial evaluation for treatment of the multitrauma.  Past Medical History:  Diagnosis Date  . Acute on chronic respiratory failure with hypoxia (HCC)   . Chronic atrial fibrillation (HCC)   . Clostridium enterocolitis   . Healthcare-associated pneumonia   . Hemopneumothorax on right   . Traumatic brain injury (HCC)   . Traumatic subarachnoid hemorrhage with loss of consciousness of unspecified duration, sequela (HCC)     Social History   Socioeconomic History  . Marital status: Not on file    Spouse name: Not on file  . Number of children: Not on file  . Years of education: Not on file  . Highest education level: Not on file  Occupational History  . Not on file  Tobacco Use  . Smoking status: Not on file  Substance and Sexual Activity  . Alcohol use: Not on file  . Drug use: Not on file  . Sexual activity: Not on file  Other Topics Concern  . Not on file  Social History Narrative  . Not on file   Social Determinants of Health   Financial Resource Strain:   . Difficulty of Paying Living Expenses: Not on file  Food Insecurity:   . Worried About Programme researcher, broadcasting/film/video in the Last Year: Not on file  . Ran Out of Food in the Last Year: Not on file  Transportation Needs:   . Lack of Transportation (Medical): Not on file  . Lack of Transportation (Non-Medical): Not on file  Physical Activity:   . Days of Exercise per Week: Not on file  . Minutes of Exercise per Session: Not on file  Stress:   . Feeling of Stress : Not on file  Social Connections:   . Frequency of Communication with  Friends and Family: Not on file  . Frequency of Social Gatherings with Friends and Family: Not on file  . Attends Religious Services: Not on file  . Active Member of Clubs or Organizations: Not on file  . Attends Banker Meetings: Not on file  . Marital Status: Not on file   No family history on file. - negative except otherwise stated in the family history section Not on File Prior to Admission medications   Not on File   DG Pelvis 1-2 Views  Result Date: 07/21/2019 CLINICAL DATA:  History of pelvic fractures. EXAM: PELVIS - 1-2 VIEW COMPARISON:  None. FINDINGS: Both hips are normally located. No hip fracture or AVN. The pubic symphysis and SI joints are intact. There are healing right-sided superior and inferior pubic rami fractures. IMPRESSION: Healing right-sided superior and inferior pubic rami fractures. Electronically Signed   By: Rudie Meyer M.D.   On: 07/21/2019 16:41   DG Tibia/Fibula Left  Result Date: 07/21/2019 CLINICAL DATA:  74 year old male status post ORIF. EXAM: LEFT TIBIA AND FIBULA - 2 VIEW COMPARISON:  None. FINDINGS: Two cannulated screws traverse the left medial malleolus. There is cast or splint material in place throughout the entire tib fib and visible left foot. No other fracture identified. Preserved alignment at the left knee with no knee joint effusion on the  cross-table lateral view. IMPRESSION: Left medial malleolus ORIF with overlying cast and no other osseous abnormality identified. Electronically Signed   By: Genevie Ann M.D.   On: 07/21/2019 16:41   DG Tibia/Fibula Right  Result Date: 07/21/2019 CLINICAL DATA:  74 year old male status post ORIF. EXAM: RIGHT TIBIA AND FIBULA - 2 VIEW COMPARISON:  None. FINDINGS: Right tibia intramedullary rod in place with a single proximal and dual distal interlocking cortical screws. Hardware appears intact. There is a distal 3rd tibia shaft transverse fracture which is nondisplaced. There is an adjacent mildly  oblique distal fibula shaft fracture with 1/2 shaft with lateral displacement. Overlying cast or splint material. Alignment at the right knee and ankle appears preserved. No knee joint effusion identified. IMPRESSION: 1. Left tibia intramedullary rod with nondisplaced distal 3rd tibia shaft fracture, no adverse hardware features. 2. Mildly displaced distal 3rd fibular shaft fracture. 3. Overlying cast. Electronically Signed   By: Genevie Ann M.D.   On: 07/21/2019 16:43   - pertinent xrays, CT, MRI studies were reviewed and independently interpreted  Positive ROS: All other systems have been reviewed and were otherwise negative with the exception of those mentioned in the HPI and as above.  Physical Exam: General: Alert, no acute distress Psychiatric: Patient is competent for consent with normal mood and affect Lymphatic: No axillary or cervical lymphadenopathy Cardiovascular: No pedal edema Respiratory: No cyanosis, no use of accessory musculature GI: No organomegaly, abdomen is soft and non-tender    Images:  @ENCIMAGES @  Labs:  Lab Results  Component Value Date   CRP 8.5 (H) 06/30/2019   REPTSTATUS 07/13/2019 FINAL 07/12/2019   CULT (A) 07/12/2019    <10,000 COLONIES/mL INSIGNIFICANT GROWTH Performed at [facility] [address] [place], [place] [address]     Lab Results  Component Value Date   ALBUMIN 2.5 (L) 06/30/2019    Neurologic: Patient does not have protective sensation bilateral lower extremities.   MUSCULOSKELETAL:   Both lower extremity plaster splints were removed the skin is examined.  Skin: Examination patient does have some abrasions to both lower extremities.  There is one abrasion over the tibia on the right there is no signs of infection this appears superficial.  The surgical incisions have well-healed.  No signs of infection either lower extremity.  No decubitus heel ulcers bilaterally.  Review of the radiographs shows a healing inferior and  superior pubic rami fracture on the right.  Patient has stable intramedullary nail fixation of the right tibia and fibular fracture with no significant callus formation at this time.  Cannulated screw fixation of the left medial malleolus shows stable congruent mortise with no complicating features.  Assessment: Assessment: Right inferior and superior pubic rami fractures of the pelvis with intramedullary nail fixation of the right tibia and cannulated screw fixation of the left medial malleolus  Plan: Plan: Patient may begin weightbearing as tolerated on both lower extremities.  Patient should wear a Cam fracture boot for gait training on the right does not need to wear this in bed.  Patient can use a regular shoe for the left foot.  No restrictions with the pubic rami fractures.  Recommend repeat radiographs in a month.  Thank you for the consult and the opportunity to see Paul West, [place] [phone] 9:27 AM

## 2019-07-23 ENCOUNTER — Institutional Professional Consult (permissible substitution) (HOSPITAL_COMMUNITY): Payer: Medicare Other

## 2019-07-23 MED ORDER — GENERIC EXTERNAL MEDICATION
Status: DC
Start: ? — End: 2019-07-23

## 2019-07-25 MED ORDER — GENERIC EXTERNAL MEDICATION
Status: DC
Start: ? — End: 2019-07-25

## 2019-07-26 ENCOUNTER — Other Ambulatory Visit (HOSPITAL_COMMUNITY): Payer: Medicare Other

## 2019-07-26 LAB — SARS CORONAVIRUS 2 (TAT 6-24 HRS): SARS Coronavirus 2: NEGATIVE

## 2019-07-26 MED ORDER — GENERIC EXTERNAL MEDICATION
Status: DC
Start: ? — End: 2019-07-26

## 2019-07-27 ENCOUNTER — Other Ambulatory Visit (HOSPITAL_COMMUNITY): Payer: Medicare Other

## 2019-07-27 MED ORDER — GENERIC EXTERNAL MEDICATION
Status: DC
Start: ? — End: 2019-07-27

## 2020-09-06 IMAGING — DX DG CHEST 1V PORT
1 series · 1 of 1 positions shown · non-contrast
Comparison: June 30, 2019.

CLINICAL DATA: Pneumonia.

EXAM:
PORTABLE CHEST 1 VIEW

[chest ap]
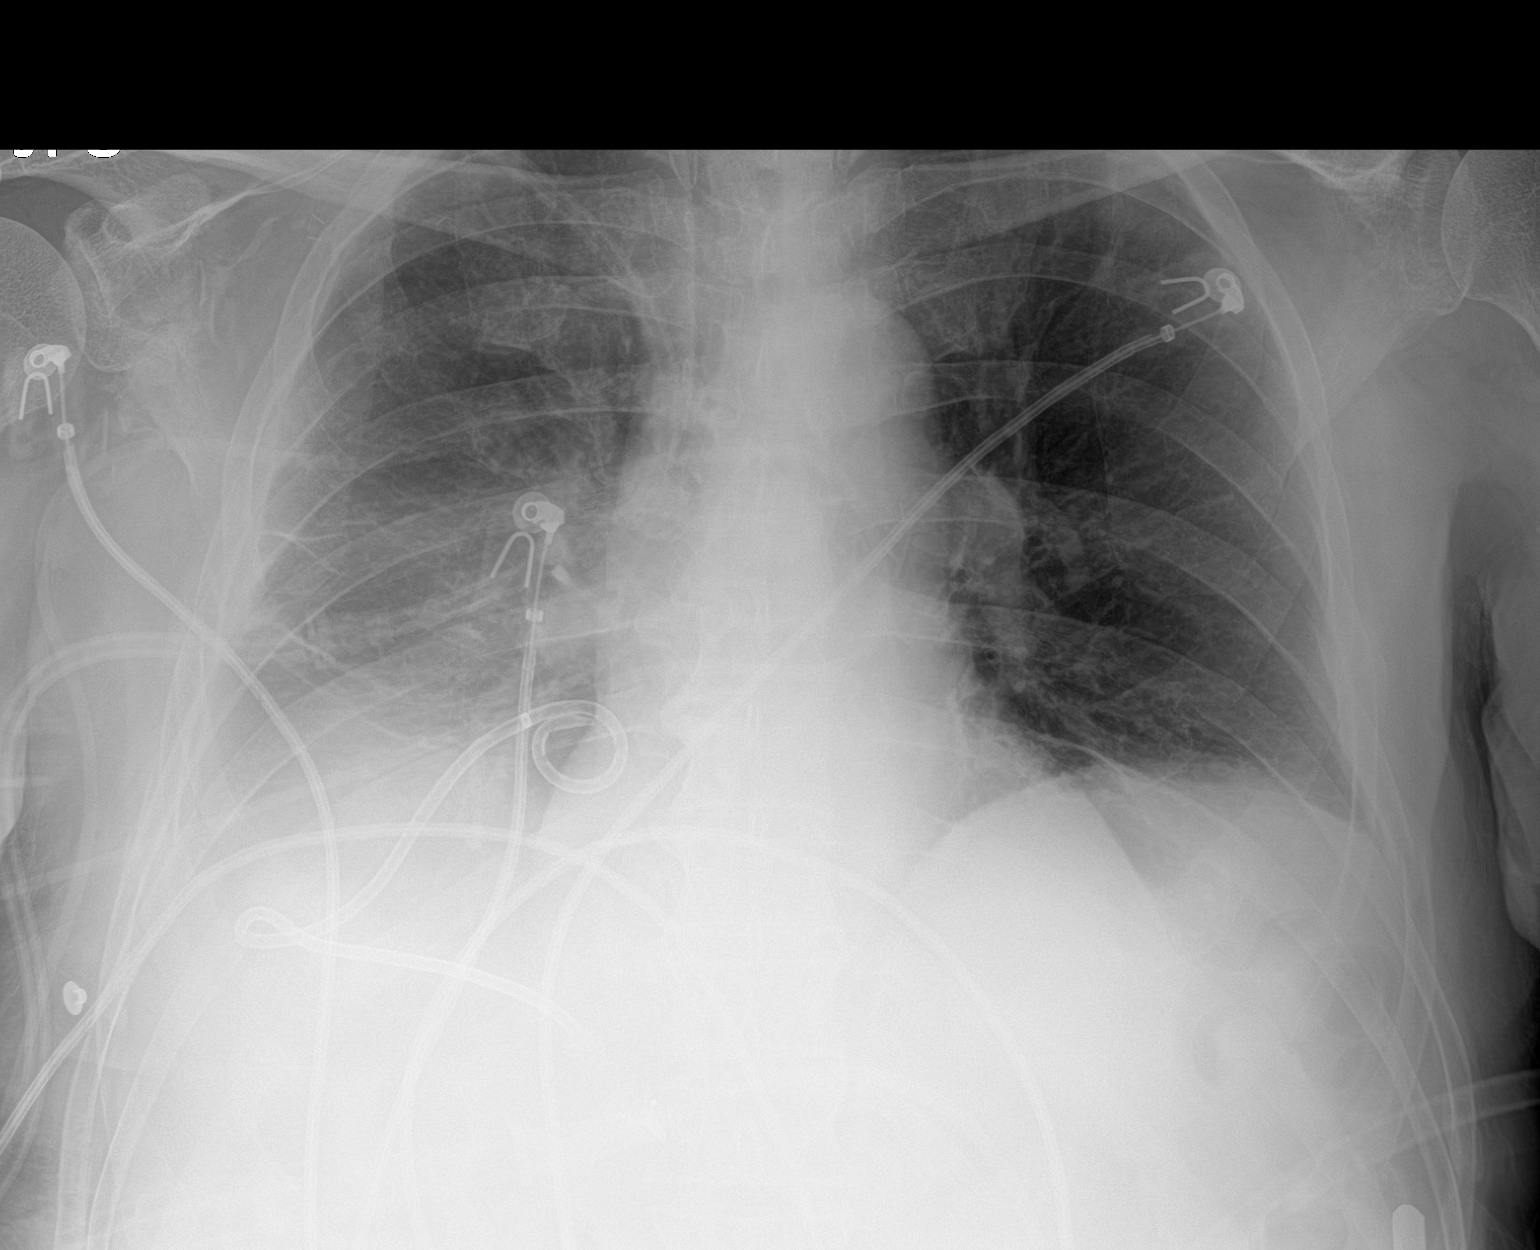

[1 of 1 positions shown; findings below may reference images not displayed]

FINDINGS: The heart size and mediastinal contours are within normal limits.
Right-sided pigtail drainage catheter is noted in right lung base.
Mild to moderate right pleural effusion remains. No pneumothorax is
noted. Tracheostomy tube is not well visualized currently. Minimal
left basilar subsegmental atelectasis. Bony thorax is unremarkable.
IMPRESSION: Right-sided pigtail drainage catheter is noted in right lung base.
Mild to moderate right pleural effusion remains with associated
atelectasis or infiltrate. Minimal left basilar subsegmental
atelectasis.

## 2020-09-16 IMAGING — DX DG CHEST 1V PORT
1 series · 1 of 1 positions shown · non-contrast
Comparison: 07/13/2019

CLINICAL DATA: Pleural effusion. Right chest tube.

EXAM:
PORTABLE CHEST 1 VIEW

[chest]
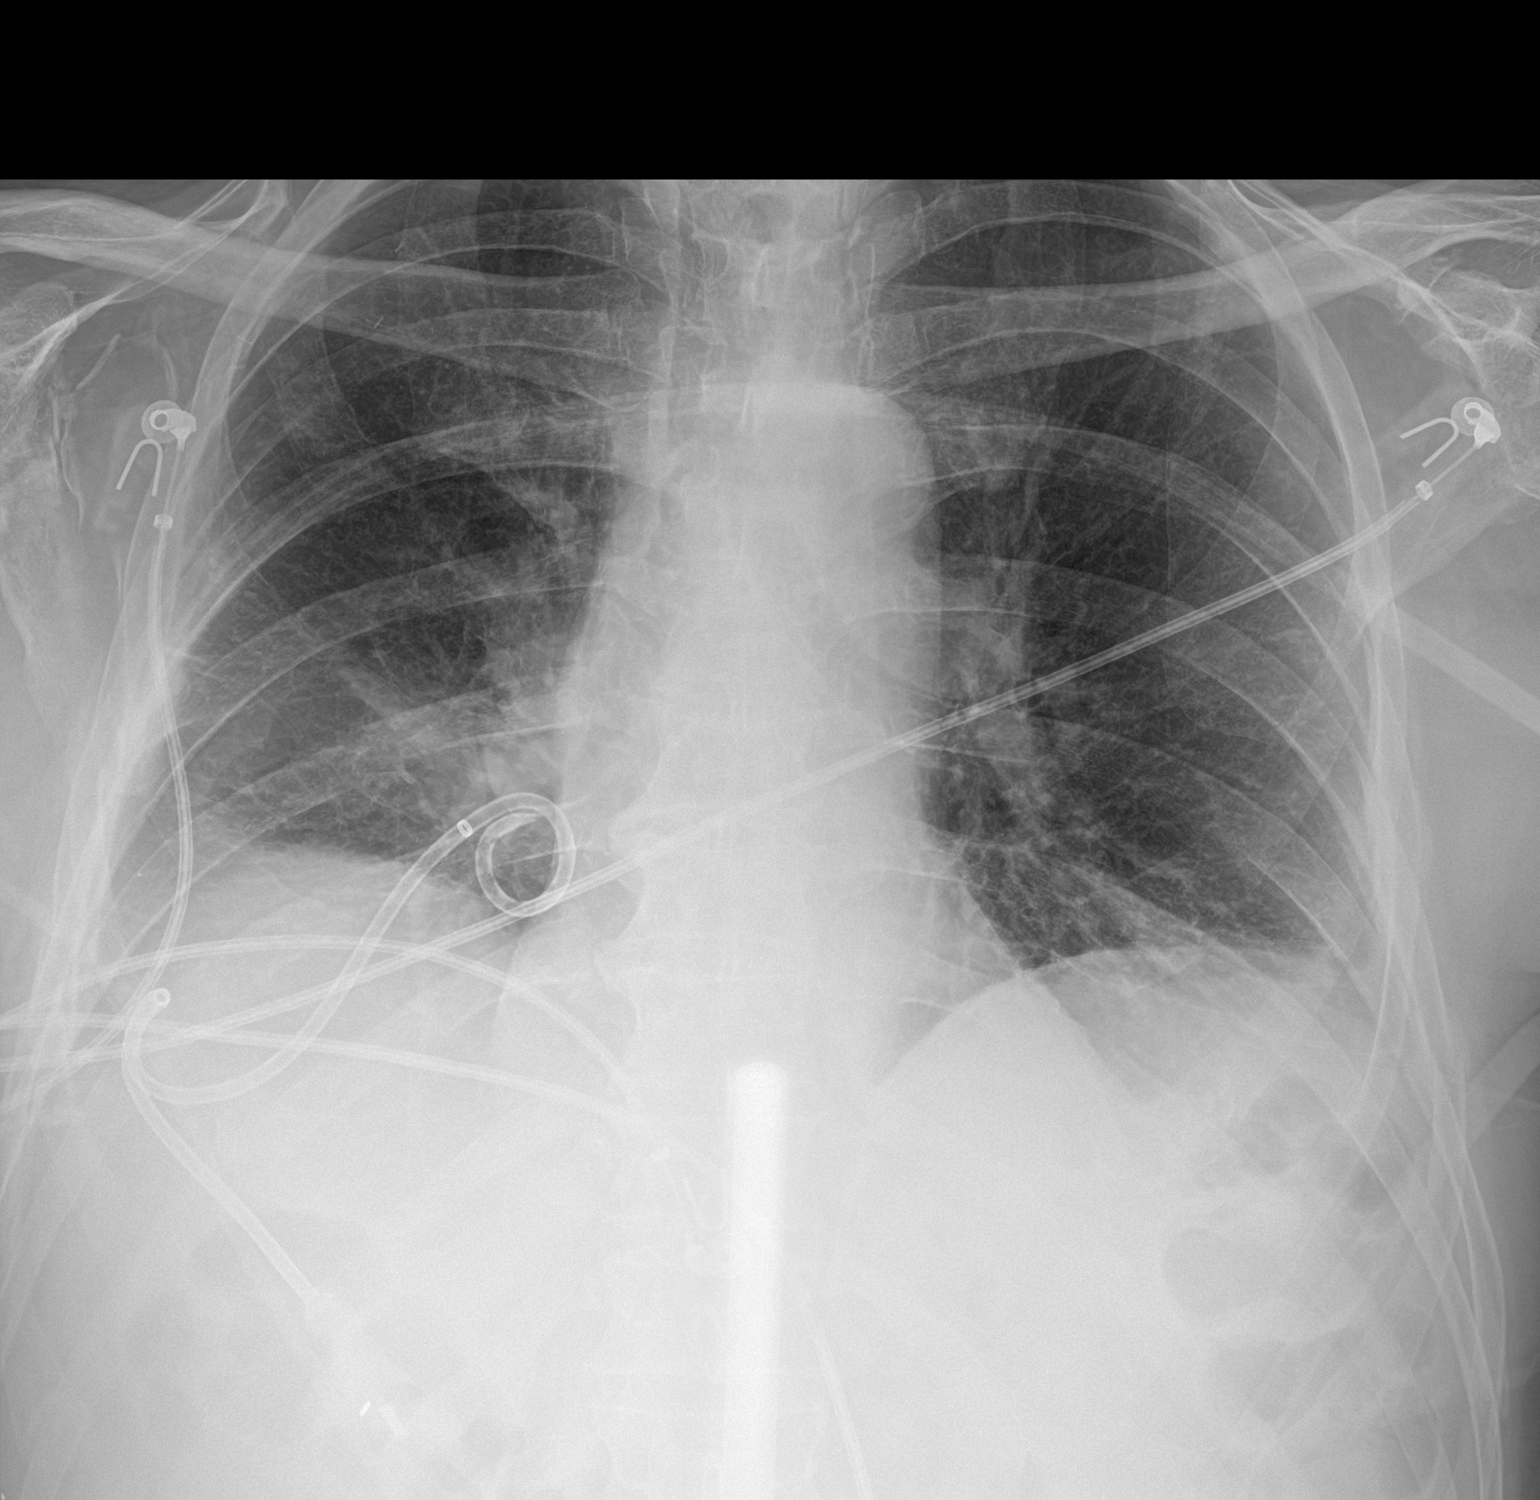

[1 of 1 positions shown; findings below may reference images not displayed]

FINDINGS: Right chest tube remains in place, unchanged. The right pleural
effusion has almost completely resolved with some minimal residual
haziness at the right base.

Minimal atelectasis at the left base laterally. The lungs are
otherwise clear.

Heart size and vascularity are normal. No significant bone
abnormality.
IMPRESSION: Almost complete resolution of the right pleural effusion. No
pneumothorax.

## 2020-09-19 IMAGING — DX DG CHEST 1V PORT
1 series · 1 of 1 positions shown · non-contrast
Comparison: 07/23/2019

CLINICAL DATA: Chest tube removal.

EXAM:
PORTABLE CHEST 1 VIEW

[chest]
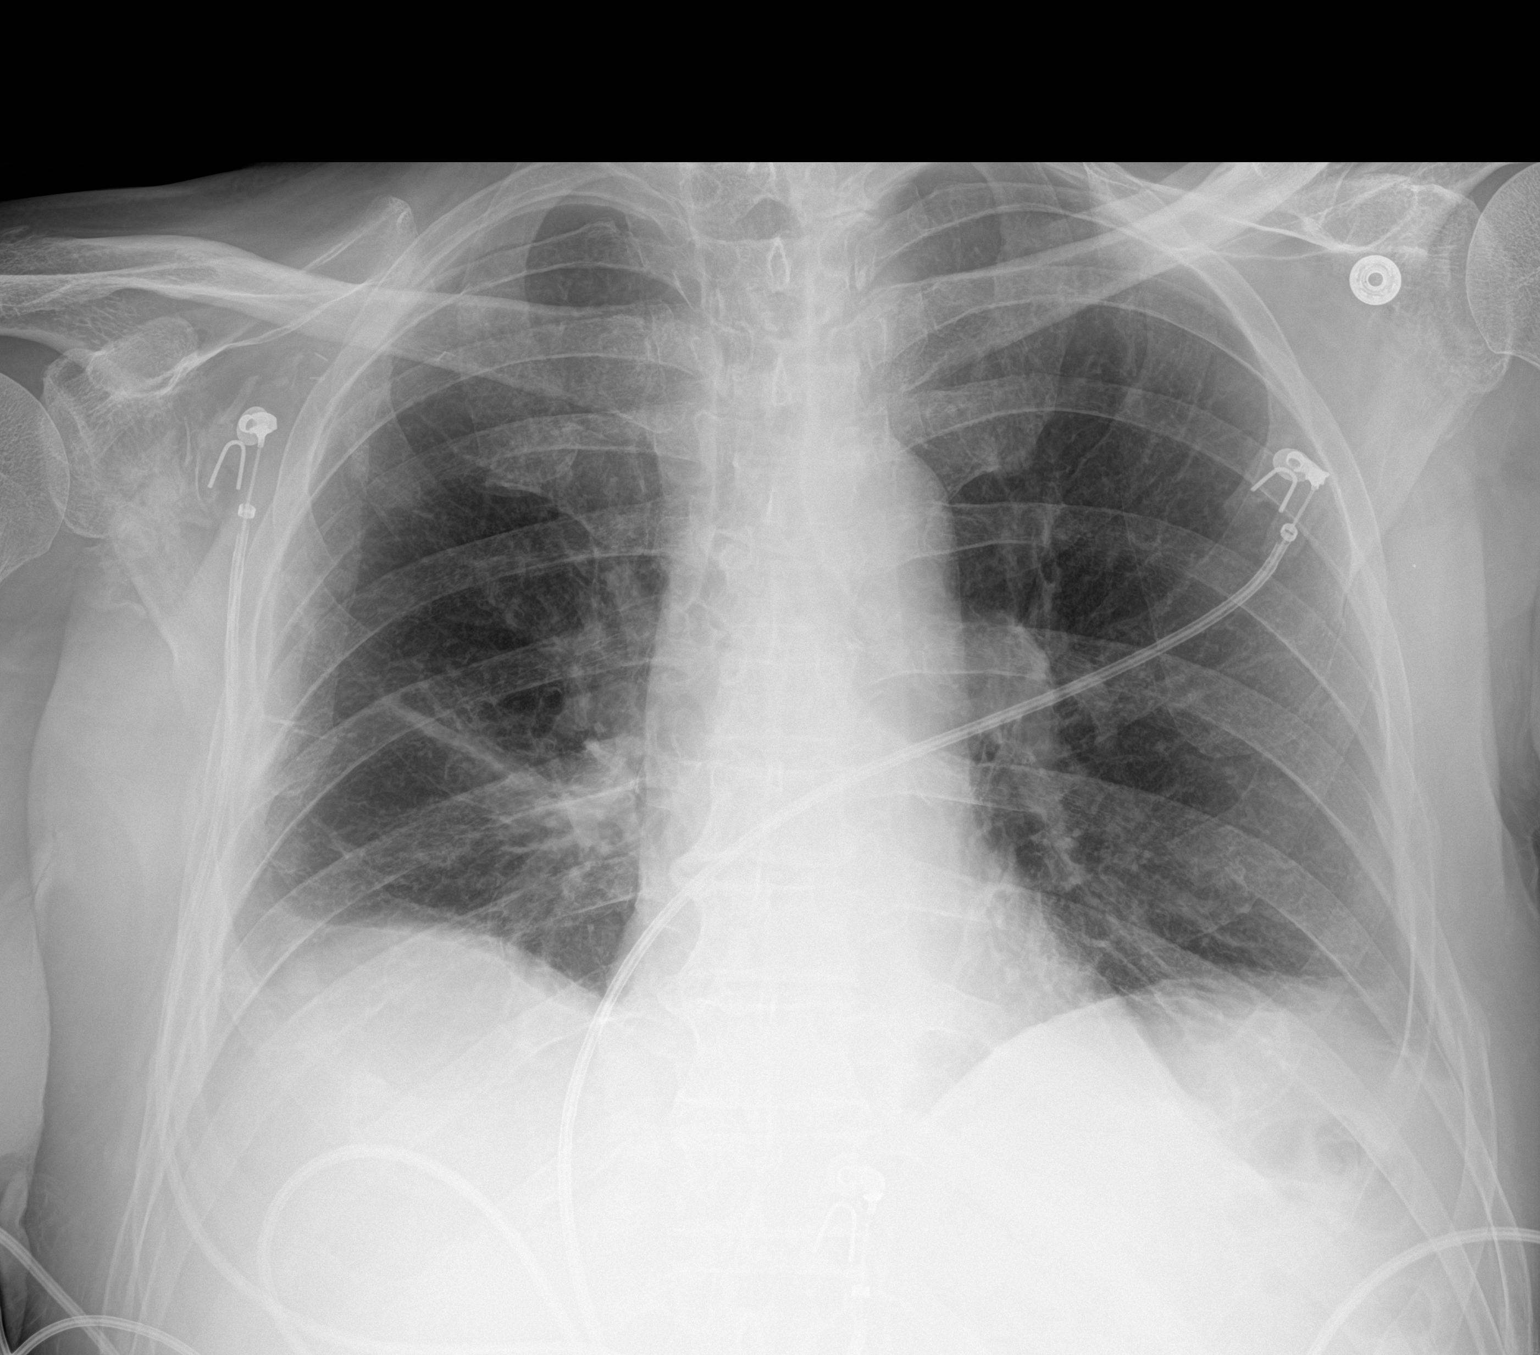

[1 of 1 positions shown; findings below may reference images not displayed]

FINDINGS: Interval removal of right basilar pigtail pleural drainage catheter.
Lungs are hypoinflated with minimal linear atelectasis right
midlung. Possible small amount of bilateral pleural fluid unchanged.
No pneumothorax. Cardiomediastinal silhouette and remainder of the
exam is unchanged.
IMPRESSION: 1. Mild right midlung linear atelectasis. Suggestion of small amount
of bilateral pleural fluid unchanged.

2. Interval removal right pleural drainage catheter. No
pneumothorax.
